# Patient Record
Sex: Female | Born: 1940 | Race: White | Hispanic: No | Marital: Married
Health system: Southern US, Community
[De-identification: ages and names within clinical notes are randomized; demographics above are authoritative.]

## PROBLEM LIST (undated history)

## (undated) ENCOUNTER — Emergency Department (HOSPITAL_COMMUNITY): Admission: EM | Payer: Medicare Other | Source: Home / Self Care

## (undated) DIAGNOSIS — N83209 Unspecified ovarian cyst, unspecified side: Secondary | ICD-10-CM

## (undated) DIAGNOSIS — I10 Essential (primary) hypertension: Secondary | ICD-10-CM

## (undated) DIAGNOSIS — L309 Dermatitis, unspecified: Secondary | ICD-10-CM

## (undated) DIAGNOSIS — E079 Disorder of thyroid, unspecified: Secondary | ICD-10-CM

## (undated) DIAGNOSIS — D649 Anemia, unspecified: Secondary | ICD-10-CM

## (undated) HISTORY — DX: Disorder of thyroid, unspecified: E07.9

## (undated) HISTORY — DX: Anemia, unspecified: D64.9

## (undated) HISTORY — DX: Essential (primary) hypertension: I10

## (undated) HISTORY — DX: Dermatitis, unspecified: L30.9

## (undated) HISTORY — DX: Unspecified ovarian cyst, unspecified side: N83.209

---

## 1998-10-23 ENCOUNTER — Other Ambulatory Visit: Admission: RE | Admit: 1998-10-23 | Discharge: 1998-10-23 | Payer: Self-pay | Admitting: *Deleted

## 1999-11-05 ENCOUNTER — Other Ambulatory Visit: Admission: RE | Admit: 1999-11-05 | Discharge: 1999-11-05 | Payer: Self-pay | Admitting: *Deleted

## 2000-11-08 ENCOUNTER — Other Ambulatory Visit: Admission: RE | Admit: 2000-11-08 | Discharge: 2000-11-08 | Payer: Self-pay | Admitting: *Deleted

## 2001-01-17 ENCOUNTER — Encounter: Admission: RE | Admit: 2001-01-17 | Discharge: 2001-01-17 | Payer: Self-pay | Admitting: Family Medicine

## 2001-01-17 ENCOUNTER — Encounter: Payer: Self-pay | Admitting: Family Medicine

## 2001-01-30 ENCOUNTER — Encounter (INDEPENDENT_AMBULATORY_CARE_PROVIDER_SITE_OTHER): Payer: Self-pay

## 2001-01-30 ENCOUNTER — Observation Stay (HOSPITAL_COMMUNITY): Admission: RE | Admit: 2001-01-30 | Discharge: 2001-01-30 | Payer: Self-pay | Admitting: Surgery

## 2001-11-13 ENCOUNTER — Other Ambulatory Visit: Admission: RE | Admit: 2001-11-13 | Discharge: 2001-11-13 | Payer: Self-pay | Admitting: *Deleted

## 2002-11-22 ENCOUNTER — Other Ambulatory Visit: Admission: RE | Admit: 2002-11-22 | Discharge: 2002-11-22 | Payer: Self-pay | Admitting: *Deleted

## 2003-11-25 ENCOUNTER — Other Ambulatory Visit: Admission: RE | Admit: 2003-11-25 | Discharge: 2003-11-25 | Payer: Self-pay | Admitting: *Deleted

## 2004-02-11 ENCOUNTER — Ambulatory Visit: Payer: Self-pay | Admitting: Family Medicine

## 2004-02-25 ENCOUNTER — Ambulatory Visit: Payer: Self-pay | Admitting: Family Medicine

## 2004-05-07 ENCOUNTER — Ambulatory Visit: Payer: Self-pay | Admitting: Family Medicine

## 2004-05-14 ENCOUNTER — Ambulatory Visit: Payer: Self-pay | Admitting: Family Medicine

## 2004-06-09 ENCOUNTER — Ambulatory Visit: Payer: Self-pay | Admitting: Family Medicine

## 2004-06-29 ENCOUNTER — Ambulatory Visit: Payer: Self-pay | Admitting: Family Medicine

## 2005-06-21 ENCOUNTER — Ambulatory Visit: Payer: Self-pay | Admitting: Family Medicine

## 2005-07-15 ENCOUNTER — Encounter: Payer: Self-pay | Admitting: Family Medicine

## 2005-07-15 ENCOUNTER — Other Ambulatory Visit: Admission: RE | Admit: 2005-07-15 | Discharge: 2005-07-15 | Payer: Self-pay | Admitting: Family Medicine

## 2005-07-15 ENCOUNTER — Ambulatory Visit: Payer: Self-pay | Admitting: Family Medicine

## 2005-08-18 ENCOUNTER — Ambulatory Visit: Payer: Self-pay | Admitting: Family Medicine

## 2006-02-17 ENCOUNTER — Ambulatory Visit: Payer: Self-pay | Admitting: Family Medicine

## 2006-06-20 ENCOUNTER — Encounter: Payer: Self-pay | Admitting: Family Medicine

## 2006-06-20 ENCOUNTER — Other Ambulatory Visit: Admission: RE | Admit: 2006-06-20 | Discharge: 2006-06-20 | Payer: Self-pay | Admitting: Family Medicine

## 2006-06-20 ENCOUNTER — Ambulatory Visit: Payer: Self-pay | Admitting: Family Medicine

## 2006-06-20 LAB — CONVERTED CEMR LAB
ALT: 17 units/L (ref 0–40)
AST: 21 units/L (ref 0–37)
Albumin: 3.9 g/dL (ref 3.5–5.2)
Alkaline Phosphatase: 46 units/L (ref 39–117)
BUN: 26 mg/dL — ABNORMAL HIGH (ref 6–23)
Basophils Absolute: 0.1 10*3/uL (ref 0.0–0.1)
Basophils Relative: 0.8 % (ref 0.0–1.0)
Bilirubin, Direct: 0.2 mg/dL (ref 0.0–0.3)
CO2: 30 meq/L (ref 19–32)
Calcium: 9.6 mg/dL (ref 8.4–10.5)
Chloride: 103 meq/L (ref 96–112)
Cholesterol: 166 mg/dL (ref 0–200)
Creatinine, Ser: 1.1 mg/dL (ref 0.4–1.2)
Eosinophils Absolute: 0.2 10*3/uL (ref 0.0–0.6)
Eosinophils Relative: 2.5 % (ref 0.0–5.0)
GFR calc Af Amer: 64 mL/min
GFR calc non Af Amer: 53 mL/min
Glucose, Bld: 97 mg/dL (ref 70–99)
HCT: 36.5 % (ref 36.0–46.0)
HDL: 45.4 mg/dL (ref >39.0)
Hemoglobin: 12.7 g/dL (ref 12.0–15.0)
LDL Cholesterol: 99 mg/dL (ref 0–99)
Lymphocytes Relative: 28.4 % (ref 12.0–46.0)
MCHC: 34.7 g/dL (ref 30.0–36.0)
MCV: 89 fL (ref 78.0–100.0)
Monocytes Absolute: 0.6 10*3/uL (ref 0.2–0.7)
Monocytes Relative: 8.6 % (ref 3.0–11.0)
Neutro Abs: 3.9 10*3/uL (ref 1.4–7.7)
Neutrophils Relative %: 59.7 % (ref 43.0–77.0)
Platelets: 302 10*3/uL (ref 150–400)
Potassium: 4.1 meq/L (ref 3.5–5.1)
RBC: 4.1 M/uL (ref 3.87–5.11)
RDW: 12 % (ref 11.5–14.6)
Sodium: 141 meq/L (ref 135–145)
TSH: 1.19 microintl units/mL (ref 0.35–5.50)
Total Bilirubin: 0.8 mg/dL (ref 0.3–1.2)
Total CHOL/HDL Ratio: 3.7
Total Protein: 6.3 g/dL (ref 6.0–8.3)
Triglycerides: 109 mg/dL (ref 0–149)
VLDL: 22 mg/dL (ref 0–40)
WBC: 6.7 10*3/uL (ref 4.5–10.5)

## 2006-10-03 ENCOUNTER — Ambulatory Visit: Payer: Self-pay | Admitting: Family Medicine

## 2006-10-10 ENCOUNTER — Ambulatory Visit: Payer: Self-pay | Admitting: Family Medicine

## 2006-11-18 DIAGNOSIS — Z8739 Personal history of other diseases of the musculoskeletal system and connective tissue: Secondary | ICD-10-CM

## 2006-12-02 ENCOUNTER — Ambulatory Visit: Payer: Self-pay | Admitting: Family Medicine

## 2006-12-26 ENCOUNTER — Telehealth: Payer: Self-pay | Admitting: Family Medicine

## 2007-01-20 ENCOUNTER — Telehealth: Payer: Self-pay | Admitting: Family Medicine

## 2007-02-16 ENCOUNTER — Ambulatory Visit: Payer: Self-pay | Admitting: Family Medicine

## 2007-06-12 ENCOUNTER — Ambulatory Visit: Payer: Self-pay | Admitting: Family Medicine

## 2007-06-12 DIAGNOSIS — T887XXA Unspecified adverse effect of drug or medicament, initial encounter: Secondary | ICD-10-CM | POA: Insufficient documentation

## 2007-06-12 DIAGNOSIS — N3000 Acute cystitis without hematuria: Secondary | ICD-10-CM

## 2007-06-12 DIAGNOSIS — D649 Anemia, unspecified: Secondary | ICD-10-CM | POA: Insufficient documentation

## 2007-06-12 DIAGNOSIS — E785 Hyperlipidemia, unspecified: Secondary | ICD-10-CM

## 2007-06-12 DIAGNOSIS — E039 Hypothyroidism, unspecified: Secondary | ICD-10-CM | POA: Insufficient documentation

## 2007-06-12 LAB — CONVERTED CEMR LAB
ALT: 15 units/L (ref 0–35)
AST: 19 units/L (ref 0–37)
Basophils Relative: 1.9 % — ABNORMAL HIGH (ref 0.0–1.0)
Bilirubin Urine: NEGATIVE
Bilirubin, Direct: 0.1 mg/dL (ref 0.0–0.3)
Blood in Urine, dipstick: NEGATIVE
Calcium: 9.2 mg/dL (ref 8.4–10.5)
Chloride: 106 meq/L (ref 96–112)
Eosinophils Absolute: 0.3 10*3/uL (ref 0.0–0.6)
Eosinophils Relative: 5.3 % — ABNORMAL HIGH (ref 0.0–5.0)
GFR calc non Af Amer: 48 mL/min
Glucose, Bld: 97 mg/dL (ref 70–99)
Glucose, Urine, Semiquant: NEGATIVE
Ketones, urine, test strip: NEGATIVE
LDL Cholesterol: 103 mg/dL — ABNORMAL HIGH (ref 0–99)
MCV: 90.8 fL (ref 78.0–100.0)
Nitrite: NEGATIVE
Platelets: 288 10*3/uL (ref 150–400)
RBC: 3.67 M/uL — ABNORMAL LOW (ref 3.87–5.11)
RDW: 11.8 % (ref 11.5–14.6)
Specific Gravity, Urine: 1.02
Total CHOL/HDL Ratio: 4.1
Triglycerides: 79 mg/dL (ref 0–149)
Urobilinogen, UA: 0.2
VLDL: 16 mg/dL (ref 0–40)
WBC: 6.3 10*3/uL (ref 4.5–10.5)
pH: 7

## 2007-06-26 ENCOUNTER — Ambulatory Visit: Payer: Self-pay | Admitting: Family Medicine

## 2007-06-26 ENCOUNTER — Encounter: Payer: Self-pay | Admitting: Family Medicine

## 2007-06-26 ENCOUNTER — Other Ambulatory Visit: Admission: RE | Admit: 2007-06-26 | Discharge: 2007-06-26 | Payer: Self-pay | Admitting: Family Medicine

## 2007-06-26 DIAGNOSIS — I10 Essential (primary) hypertension: Secondary | ICD-10-CM | POA: Insufficient documentation

## 2007-06-26 DIAGNOSIS — N952 Postmenopausal atrophic vaginitis: Secondary | ICD-10-CM

## 2007-06-26 DIAGNOSIS — L301 Dyshidrosis [pompholyx]: Secondary | ICD-10-CM

## 2007-07-27 ENCOUNTER — Ambulatory Visit: Payer: Self-pay | Admitting: Family Medicine

## 2007-08-01 ENCOUNTER — Telehealth: Payer: Self-pay | Admitting: Family Medicine

## 2007-08-01 DIAGNOSIS — L820 Inflamed seborrheic keratosis: Secondary | ICD-10-CM

## 2007-08-02 ENCOUNTER — Encounter: Payer: Self-pay | Admitting: Gastroenterology

## 2007-08-02 ENCOUNTER — Ambulatory Visit: Payer: Self-pay | Admitting: Gastroenterology

## 2007-08-16 ENCOUNTER — Telehealth: Payer: Self-pay | Admitting: Gastroenterology

## 2008-01-19 ENCOUNTER — Ambulatory Visit: Payer: Self-pay | Admitting: Family Medicine

## 2008-06-07 ENCOUNTER — Telehealth: Payer: Self-pay | Admitting: Internal Medicine

## 2008-06-12 ENCOUNTER — Ambulatory Visit: Payer: Self-pay | Admitting: Internal Medicine

## 2008-06-12 DIAGNOSIS — K5289 Other specified noninfective gastroenteritis and colitis: Secondary | ICD-10-CM | POA: Insufficient documentation

## 2008-06-12 LAB — CONVERTED CEMR LAB
AST: 27 units/L (ref 0–37)
Albumin: 3.7 g/dL (ref 3.5–5.2)
Alkaline Phosphatase: 50 units/L (ref 39–117)
BUN: 21 mg/dL (ref 6–23)
Bilirubin, Direct: 0.1 mg/dL (ref 0.0–0.3)
Chloride: 104 meq/L (ref 96–112)
Eosinophils Absolute: 0.2 10*3/uL (ref 0.0–0.7)
Eosinophils Relative: 2.9 % (ref 0.0–5.0)
GFR calc non Af Amer: 59 mL/min
Glucose, Bld: 82 mg/dL (ref 70–99)
HDL: 35.9 mg/dL — ABNORMAL LOW (ref >39.0)
Monocytes Relative: 8.6 % (ref 3.0–12.0)
Neutrophils Relative %: 55.1 % (ref 43.0–77.0)
Platelets: 327 10*3/uL (ref 150–400)
Potassium: 2.9 meq/L — ABNORMAL LOW (ref 3.5–5.1)
Sodium: 141 meq/L (ref 135–145)
Total CHOL/HDL Ratio: 3.7
VLDL: 17 mg/dL (ref 0–40)
WBC: 7.7 10*3/uL (ref 4.5–10.5)

## 2008-06-18 ENCOUNTER — Encounter: Payer: Self-pay | Admitting: Family Medicine

## 2008-06-18 ENCOUNTER — Ambulatory Visit: Payer: Self-pay | Admitting: Family Medicine

## 2008-06-18 ENCOUNTER — Other Ambulatory Visit: Admission: RE | Admit: 2008-06-18 | Discharge: 2008-06-18 | Payer: Self-pay | Admitting: Family Medicine

## 2008-06-18 DIAGNOSIS — N852 Hypertrophy of uterus: Secondary | ICD-10-CM | POA: Insufficient documentation

## 2008-06-19 ENCOUNTER — Encounter: Admission: RE | Admit: 2008-06-19 | Discharge: 2008-06-19 | Payer: Self-pay | Admitting: Family Medicine

## 2008-07-23 ENCOUNTER — Encounter: Admission: RE | Admit: 2008-07-23 | Discharge: 2008-07-23 | Payer: Self-pay | Admitting: Family Medicine

## 2008-08-06 ENCOUNTER — Ambulatory Visit: Payer: Self-pay | Admitting: Family Medicine

## 2008-08-06 DIAGNOSIS — L255 Unspecified contact dermatitis due to plants, except food: Secondary | ICD-10-CM

## 2008-08-06 DIAGNOSIS — R609 Edema, unspecified: Secondary | ICD-10-CM | POA: Insufficient documentation

## 2008-08-06 DIAGNOSIS — I872 Venous insufficiency (chronic) (peripheral): Secondary | ICD-10-CM | POA: Insufficient documentation

## 2008-09-11 ENCOUNTER — Ambulatory Visit: Payer: Self-pay | Admitting: Family Medicine

## 2008-09-11 DIAGNOSIS — H1030 Unspecified acute conjunctivitis, unspecified eye: Secondary | ICD-10-CM | POA: Insufficient documentation

## 2008-09-11 DIAGNOSIS — H1010 Acute atopic conjunctivitis, unspecified eye: Secondary | ICD-10-CM

## 2009-01-22 ENCOUNTER — Ambulatory Visit: Payer: Self-pay | Admitting: Family Medicine

## 2009-05-30 IMAGING — US US TRANSVAGINAL NON-OB
1 series · 14 of 25 positions shown · non-contrast
Comparison: None

CLINICAL DATA: Enlarged uterus on exam.

TRANSABDOMINAL AND TRANSVAGINAL ULTRASOUND OF PELVIS
TECHNIQUE: Both transabdominal and transvaginal ultrasound
examinations of the pelvis were performed including evaluation of
the uterus, ovaries, adnexal regions, and pelvic cul-de-sac.

[Series 1: us transvaginal non-ob · 0.29mm/px · 14 of 60 slices shown]
[im 1/60]
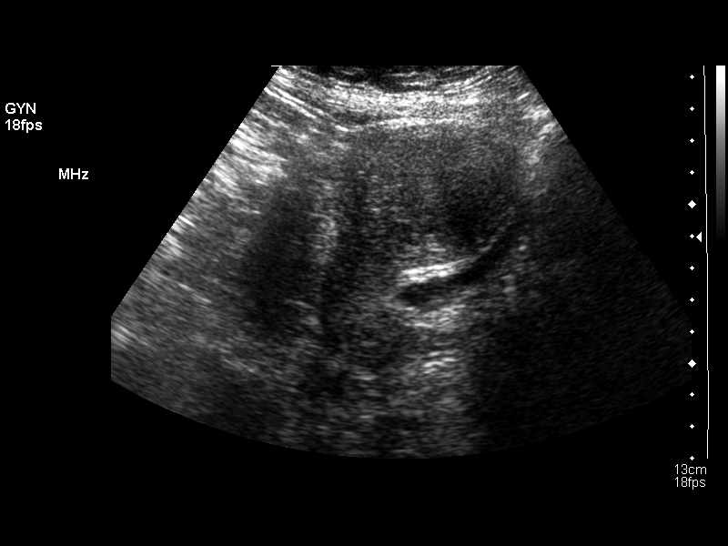
[im 5/60]
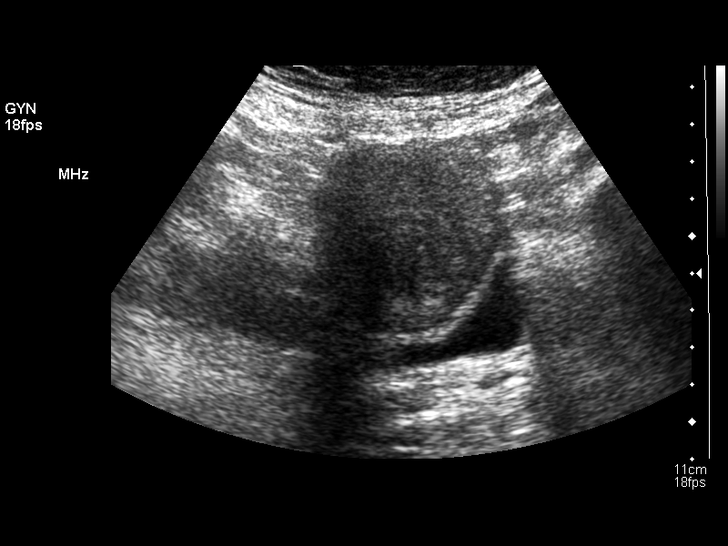
[im 10/60]
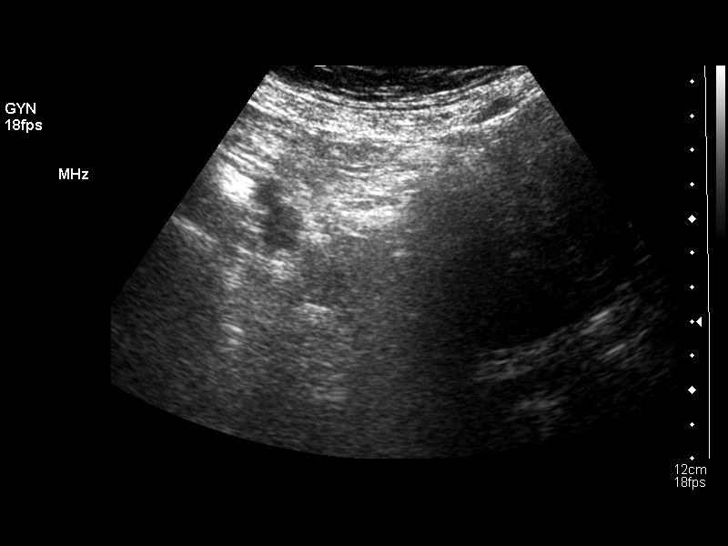
[im 15/60]
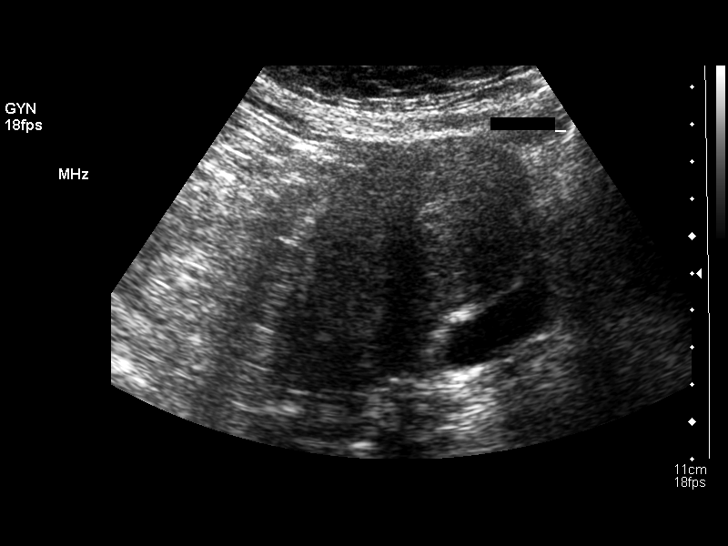
[im 20/60]
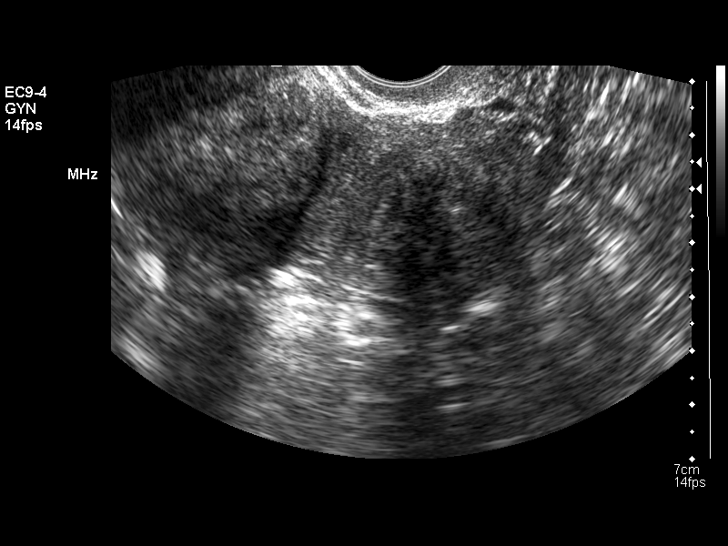
[im 23/60]
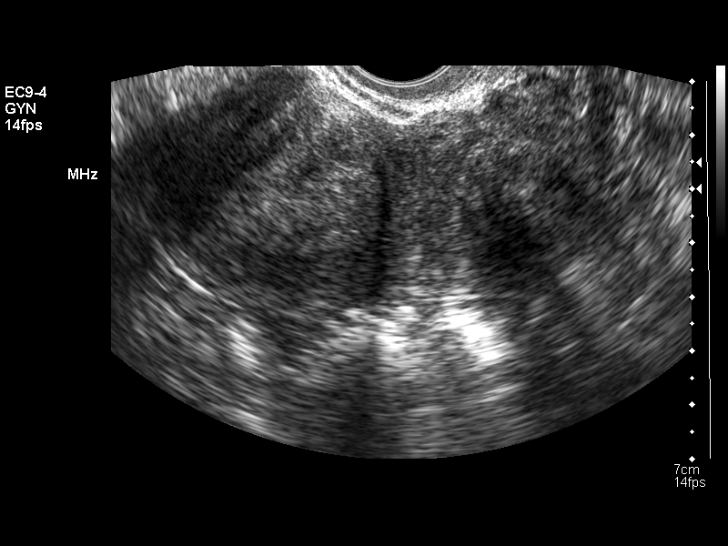
[im 28/60]
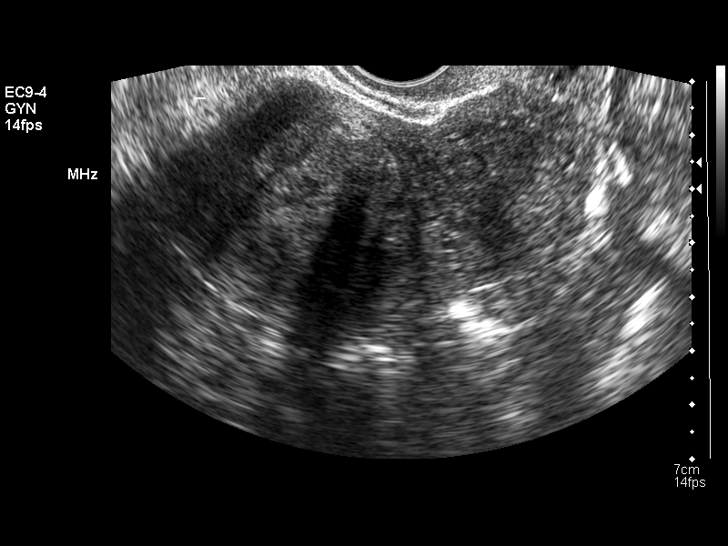
[im 32/60]
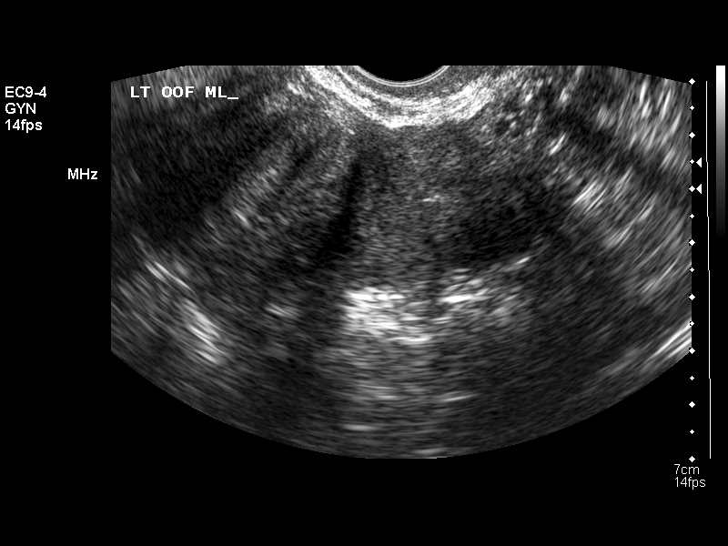
[im 37/60]
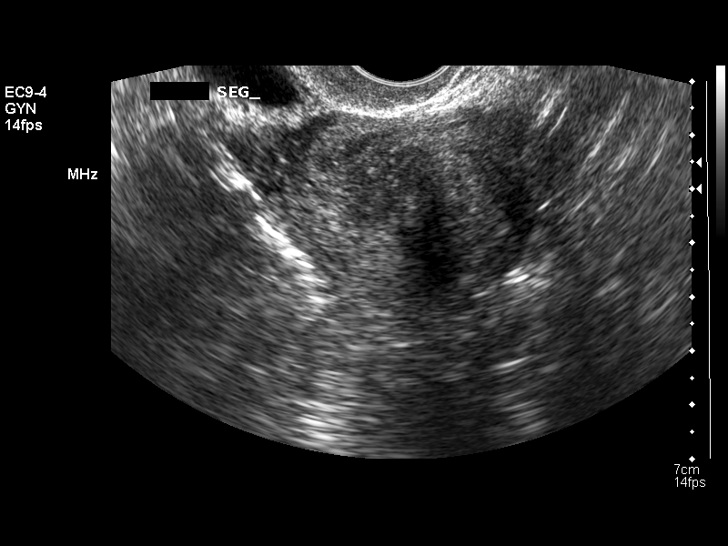
[im 40/60]
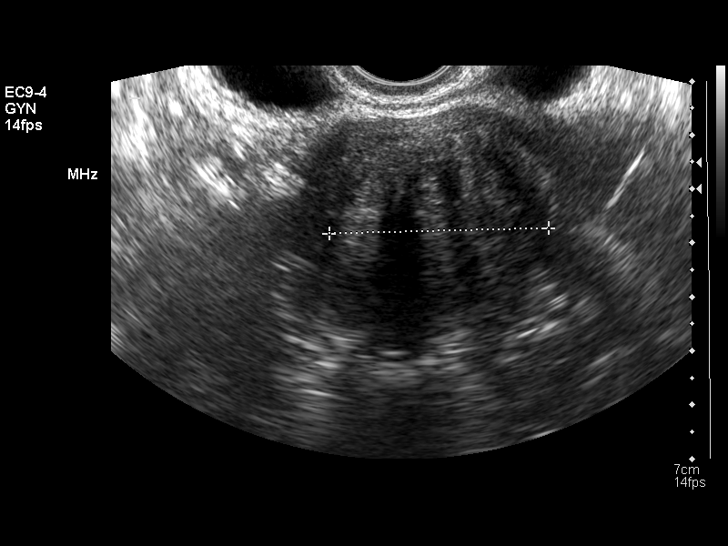
[im 45/60]
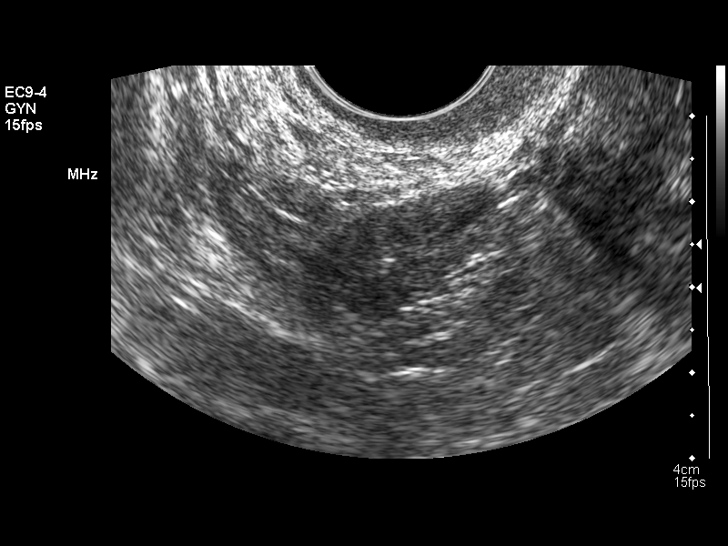
[im 50/60]
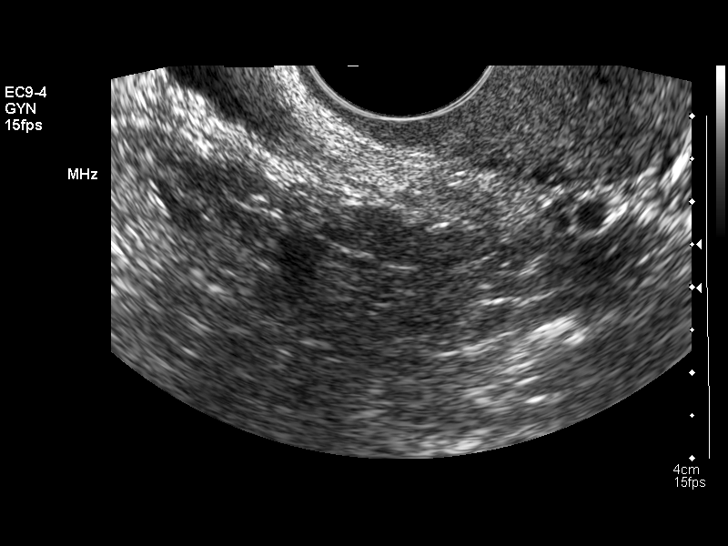
[im 55/60]
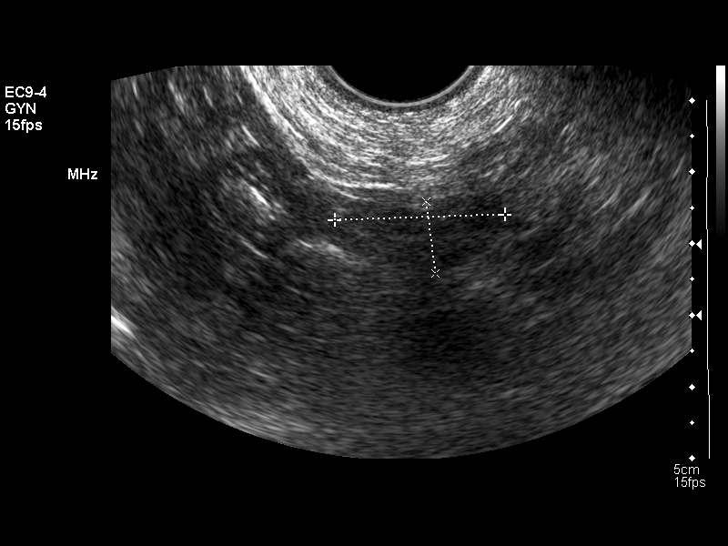
[im 60/60]
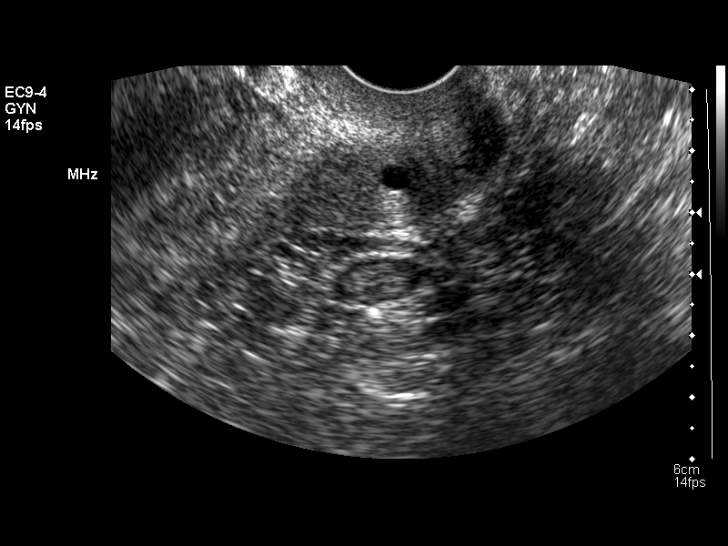

[14 of 25 positions shown; findings below may reference images not displayed]

FINDINGS: Uterus:  Uterus measures 8.5 x 5.2 x 5.1 cm.  Two fibroids noted,
the first posteriorly in the uterine body measuring up to 3 cm.
There is a second fibroid in the fundus centrally measuring up to
4.1 cm.

Endometrium:  Not visualized.  Obscured by the fibroids.

Right Ovary:  2.0 x 0.9 x 2.1 cm.  No abnormality.

Left Ovary:  2.4 x 1.0 x 1.5 cm.  No abnormality.

Other Findings:  No free fluid.
IMPRESSION: Uterine fibroids.  Otherwise unremarkable exam.

## 2009-06-02 ENCOUNTER — Telehealth: Payer: Self-pay | Admitting: Family Medicine

## 2009-06-19 ENCOUNTER — Ambulatory Visit: Payer: Self-pay | Admitting: Family Medicine

## 2009-06-19 ENCOUNTER — Other Ambulatory Visit: Admission: RE | Admit: 2009-06-19 | Discharge: 2009-06-19 | Payer: Self-pay | Admitting: Family Medicine

## 2009-07-10 ENCOUNTER — Encounter: Admission: RE | Admit: 2009-07-10 | Discharge: 2009-07-10 | Payer: Self-pay | Admitting: Family Medicine

## 2010-01-29 ENCOUNTER — Ambulatory Visit: Payer: Self-pay | Admitting: Family Medicine

## 2010-05-03 LAB — CONVERTED CEMR LAB
AST: 20 units/L (ref 0–37)
Albumin: 3.8 g/dL (ref 3.5–5.2)
Basophils Absolute: 0 10*3/uL (ref 0.0–0.1)
Basophils Absolute: 0 10*3/uL (ref 0.0–0.1)
CO2: 30 meq/L (ref 19–32)
Chloride: 104 meq/L (ref 96–112)
Eosinophils Absolute: 0.2 10*3/uL (ref 0.0–0.7)
Eosinophils Absolute: 0.3 10*3/uL (ref 0.0–0.7)
Glucose, Bld: 95 mg/dL (ref 70–99)
HCT: 35.8 % — ABNORMAL LOW (ref 36.0–46.0)
HCT: 37.3 % (ref 36.0–46.0)
Hemoglobin: 12.2 g/dL (ref 12.0–15.0)
Hemoglobin: 12.6 g/dL (ref 12.0–15.0)
Ketones, urine, test strip: NEGATIVE
Lymphs Abs: 1.7 10*3/uL (ref 0.7–4.0)
MCHC: 33.6 g/dL (ref 30.0–36.0)
MCHC: 34 g/dL (ref 30.0–36.0)
MCV: 92.9 fL (ref 78.0–100.0)
Monocytes Absolute: 0.5 10*3/uL (ref 0.1–1.0)
Monocytes Absolute: 0.7 10*3/uL (ref 0.1–1.0)
Monocytes Relative: 7.2 % (ref 3.0–12.0)
Neutro Abs: 4.2 10*3/uL (ref 1.4–7.7)
Neutro Abs: 4.9 10*3/uL (ref 1.4–7.7)
Potassium: 3.7 meq/L (ref 3.5–5.1)
Protein, U semiquant: NEGATIVE
RDW: 12 % (ref 11.5–14.6)
RDW: 12.1 % (ref 11.5–14.6)
Sodium: 140 meq/L (ref 135–145)
Specific Gravity, Urine: 1.015
TSH: 1.44 microintl units/mL (ref 0.35–5.50)
Urobilinogen, UA: 0.2
Vitamin B-12: 316 pg/mL (ref 211–911)
WBC Urine, dipstick: NEGATIVE
pH: 6.5

## 2010-05-07 NOTE — Progress Notes (Signed)
Summary: REFILL  Phone Note Refill Request Message from:  Fax from Pharmacy  Refills Requested: Medication #1:  TENORMIN 25 MG TABS Take 1 tablet by mouth every morning [facility] [facility]---[phone]     FAX---[phone]  Initial call taken by: Warnell Forester,  June 02, 2009 9:02 AM    Prescriptions: TENORMIN 25 MG TABS (ATENOLOL) Take 1 tablet by mouth every morning  #100 x 3   Entered by:   Kern Reap CMA (AAMA)   Authorized by:   Roderick Pee MD   Signed by:   Kern Reap CMA (AAMA) on 06/02/2009   Method used:   Electronically to        [facility] (retail)       [address]       [place], [place]  [address]       Ph: [phone]       Fax: [phone]   RxID:   [id number]

## 2010-05-07 NOTE — Assessment & Plan Note (Signed)
Summary: EMP//WILL FAST//CM/PT [facility] FROM [facility]   Vital Signs:  Patient profile:   70 year old female Menstrual status:  postmenopausal Height:      69 inches Weight:      170 pounds BMI:     25.20 Temp:     98.5 degrees F oral BP sitting:   130 / 88  (left arm) Cuff size:   regular  Vitals Entered By: Kern Reap CMA Duncan Dull) (June 19, 2009 10:31 AM)  Reason for Visit cpx  Primary Care Provider:  Kelle Darting, MD   History of Present Illness: haillie is a 70 year old, married female, nonsmoker, who comes in today for evaluation of hypothyroidism.  Hyper-tension.  She takes thyroid 100 micrograms daily.  Will check thyroid level today.  She takes Tenormin 25 mg daily and HCTZ 12.5 mg daily.  BP 130/88.  She also takes calcium, vitamin D, aspirin, and walks daily.  Her past medical history, social history, family history reviewed in detail.  No significant changes.  She walks daily.  Mood is normal.  No depression.  Hearing and vision.  Normal.  ADLs.  Normal.  Risks for falls at home and home safety reviewed.  Height and weight normal annual eye exam by ophthalmologist.  We will check lab work to monitor blood sugar et Karie Soda.  She also gets routine dental work and declines a colonoscopy.  Tetanus 2007 seasonal flu 2010 Pneumovax 2007  Allergies: 1)  Sulfamethoxazole (Sulfamethoxazole)  Past History:  Past medical, surgical, family and social histories (including risk factors) reviewed, and no changes noted (except as noted below).  Past Medical History: Reviewed history from 06/26/2007 and no changes required. Anemia-NOS Hypertension Hypothyroidism childbirth x 1 TMA ovarian cyst eczema  Family History: Reviewed history from 08/02/2007 and no changes required. father died at 46 he had coronary disease, and gallbladder disease mother died 45, diabetes, hypertension, gallbladder disease, underlying glaucoma. No brothers one sister has gallbladder disease.   Uterus out and hypertension. No FH of Colon Cancer:  Social History: Reviewed history from 06/26/2007 and no changes required. Retired Married Never Smoked Alcohol use-no Drug use-no Regular exercise-yes  Review of Systems      See HPI  Physical Exam  General:  Well-developed,well-nourished,in no acute distress; alert,appropriate and cooperative throughout examination Head:  Normocephalic and atraumatic without obvious abnormalities. No apparent alopecia or balding. Eyes:  No corneal or conjunctival inflammation noted. EOMI. Perrla. Funduscopic exam benign, without hemorrhages, exudates or papilledema. Vision grossly normal. Ears:  External ear exam shows no significant lesions or deformities.  Otoscopic examination reveals clear canals, tympanic membranes are intact bilaterally without bulging, retraction, inflammation or discharge. Hearing is grossly normal bilaterally. Nose:  External nasal examination shows no deformity or inflammation. Nasal mucosa are pink and moist without lesions or exudates. Mouth:  Oral mucosa and oropharynx without lesions or exudates.  Teeth in good repair. Neck:  No deformities, masses, or tenderness noted. Chest Wall:  No deformities, masses, or tenderness noted. Breasts:  No mass, nodules, thickening, tenderness, bulging, retraction, inflamation, nipple discharge or skin changes noted.   Lungs:  Normal respiratory effort, chest expands symmetrically. Lungs are clear to auscultation, no crackles or wheezes. Heart:  Normal rate and regular rhythm. S1 and S2 normal without gallop, murmur, click, rub or other extra sounds. Abdomen:  Bowel sounds positive,abdomen soft and non-tender without masses, organomegaly or hernias noted. Rectal:  No external abnormalities noted. Normal sphincter tone. No rectal masses or tenderness. Genitalia:  Pelvic Exam:  External: normal female genitalia without lesions or masses        Vagina: normal without lesions or  masses        Cervix: normal without lesions or masses        Adnexa: normal bimanual exam without masses or fullness        Uterus: normal by palpation        Pap smear: performed Msk:  No deformity or scoliosis noted of thoracic or lumbar spine.   Pulses:  R and L carotid,radial,femoral,dorsalis pedis and posterior tibial pulses are full and equal bilaterally Extremities:  No clubbing, cyanosis, edema, or deformity noted with normal full range of motion of all joints.   Neurologic:  No cranial nerve deficits noted. Station and gait are normal. Plantar reflexes are down-going bilaterally. DTRs are symmetrical throughout. Sensory, motor and coordinative functions appear intact. Skin:  total body skin exam normal except for hundreds  of seborrheic keratoses Cervical Nodes:  No lymphadenopathy noted Axillary Nodes:  No palpable lymphadenopathy Inguinal Nodes:  No significant adenopathy Psych:  Cognition and judgment appear intact. Alert and cooperative with normal attention span and concentration. No apparent delusions, illusions, hallucinations   Impression & Recommendations:  Problem # 1:  PERIPHERAL EDEMA (ICD-782.3) Assessment Improved  Her updated medication list for this problem includes:    Hydrochlorothiazide 12.5 Mg Caps (Hydrochlorothiazide) .Marland Kitchen... Take 1 tablet by mouth every morning  Problem # 2:  HYPERTENSION (ICD-401.9) Assessment: Improved  Her updated medication list for this problem includes:    Tenormin 25 Mg Tabs (Atenolol) .Marland Kitchen... Take 1 tablet by mouth every morning    Hydrochlorothiazide 12.5 Mg Caps (Hydrochlorothiazide) .Marland Kitchen... Take 1 tablet by mouth every morning  Orders: Venipuncture (56213) TLB-Lipid Panel (80061-LIPID) TLB-BMP (Basic Metabolic Panel-BMET) (80048-METABOL) TLB-CBC Platelet - w/Differential (85025-CBCD) TLB-Hepatic/Liver Function Pnl (80076-HEPATIC) TLB-TSH (Thyroid Stimulating Hormone) (08657-QIO) Prescription Created Electronically  [phone]) Subsequent annual wellness visit with prevention plan (M8413) UA Dipstick w/o Micro (automated)  (81003)  Problem # 3:  HYPOTHYROIDISM (ICD-244.9) Assessment: Improved  Her updated medication list for this problem includes:    Levoxyl 100 Mcg Tabs (Levothyroxine sodium) .Marland Kitchen... 1 once daily  Orders: Venipuncture (24401) TLB-Lipid Panel (80061-LIPID) TLB-BMP (Basic Metabolic Panel-BMET) (80048-METABOL) TLB-CBC Platelet - w/Differential (85025-CBCD) TLB-Hepatic/Liver Function Pnl (80076-HEPATIC) TLB-TSH (Thyroid Stimulating Hormone) (02725-DGU) Prescription Created Electronically [phone]) Subsequent annual wellness visit with prevention plan (V4259) UA Dipstick w/o Micro (automated)  (81003)  Complete Medication List: 1)  Calcitrate/vitamin D 315-200 Mg-unit Tabs (Calcium citrate-vitamin d) .... Take 1 tablet by mouth once a day 2)  Levoxyl 100 Mcg Tabs (Levothyroxine sodium) .Marland Kitchen.. 1 once daily 3)  Tenormin 25 Mg Tabs (Atenolol) .... Take 1 tablet by mouth every morning 4)  Hydrochlorothiazide 12.5 Mg Caps (Hydrochlorothiazide) .... Take 1 tablet by mouth every morning  Other Orders: EKG w/ Interpretation (93000)  Patient Instructions: 1)  Please schedule a follow-up appointment in 1 year. 2)  It is important that you exercise regularly at least 20 minutes 5 times a week. If you develop chest pain, have severe difficulty breathing, or feel very tired , stop exercising immediately and seek medical attention. 3)  Schedule a colonoscopy/sigmoidoscopy to help detect colon cancer. 4)  Take calcium +Vitamin D daily. 5)  Take an Aspirin every day. 6)  Schedule your mammogram. Prescriptions: HYDROCHLOROTHIAZIDE 12.5 MG CAPS (HYDROCHLOROTHIAZIDE) Take 1 tablet by mouth every morning  #100 x 3   Entered and Authorized by:   Roderick Pee MD   Signed by:  Roderick Pee MD on 06/19/2009   Method used:   Electronically to        [facility] (retail)       [address]       [place], [place]  [address]       Ph: [phone]       Fax: [phone]   RxID:   [id number] TENORMIN 25 MG TABS (ATENOLOL) Take 1 tablet by mouth every morning  #100 x 3   Entered and Authorized by:   Roderick Pee MD   Signed by:   Roderick Pee MD on 06/19/2009   Method used:   Electronically to        [facility] (retail)       [address]       [place], [place]  [address]       Ph: [phone]       Fax: [phone]   RxID:   [id number] LEVOXYL 100 MCG TABS (LEVOTHYROXINE SODIUM) 1 once daily  #100 x 4   Entered and Authorized by:   Roderick Pee MD   Signed by:   Roderick Pee MD on 06/19/2009   Method used:   Electronically to        [facility] (retail)       [address]       [place], [place]  [address]       Ph: [phone]       Fax: [phone]   RxID:   [id number]     Laboratory Results   Urine Tests    Routine Urinalysis   Color: yellow Appearance: Clear Glucose: negative   (Normal Range: Negative) Bilirubin: negative   (Normal Range: Negative) Ketone: negative   (Normal Range: Negative) Spec. Gravity: 1.015   (Normal Range: 1.003-1.035) Blood: 1+   (Normal Range: Negative) pH: 6.5   (Normal Range: 5.0-8.0) Protein: negative   (Normal Range: Negative) Urobilinogen: 0.2   (Normal Range: 0-1) Nitrite: negative   (Normal Range: Negative) Leukocyte Esterace: negative   (Normal Range: Negative)    Comments: Rita Ohara  June 19, 2009 12:55 PM

## 2010-05-07 NOTE — Assessment & Plan Note (Signed)
Summary: FLU SHOT // RS----PT [facility] FOR EARLIER TIME // RS  Nurse Visit   Allergies: 1)  Sulfamethoxazole (Sulfamethoxazole)  Review of Systems       Flu Vaccine Consent Questions     Do you have a history of severe allergic reactions to this vaccine? no    Any prior history of allergic reactions to egg and/or gelatin? no    Do you have a sensitivity to the preservative Thimersol? no    Do you have a past history of Guillan-Barre Syndrome? no    Do you currently have an acute febrile illness? no    Have you ever had a severe reaction to latex? no    Vaccine information given and explained to patient? yes    Are you currently pregnant? no    Lot Number:[id number]   Exp Date:10/03/2010   Site Given  Left Deltoid IM    Orders Added: 1)  Flu Vaccine 6yrs + MEDICARE PATIENTS [Q2039] 2)  Administration Flu vaccine - MCR [G0008]

## 2010-05-22 ENCOUNTER — Other Ambulatory Visit: Payer: Self-pay | Admitting: *Deleted

## 2010-05-22 DIAGNOSIS — I10 Essential (primary) hypertension: Secondary | ICD-10-CM

## 2010-05-22 MED ORDER — HYDROCHLOROTHIAZIDE 12.5 MG PO TABS: 12.5000 mg | ORAL_TABLET | Freq: Every day | ORAL | Status: DC

## 2010-07-01 ENCOUNTER — Other Ambulatory Visit (HOSPITAL_COMMUNITY)
Admission: RE | Admit: 2010-07-01 | Discharge: 2010-07-01 | Disposition: A | Discharge status: Home / Self Care | Payer: Medicare Other | Source: Ambulatory Visit | Attending: Family Medicine | Admitting: Family Medicine

## 2010-07-01 ENCOUNTER — Ambulatory Visit (INDEPENDENT_AMBULATORY_CARE_PROVIDER_SITE_OTHER): Payer: Medicare Other | Admitting: Family Medicine

## 2010-07-01 ENCOUNTER — Encounter: Payer: Self-pay | Admitting: Family Medicine

## 2010-07-01 VITALS — BP 120/80 | Temp 98.7°F | Ht 68.0 in | Wt 178.0 lb

## 2010-07-01 DIAGNOSIS — E039 Hypothyroidism, unspecified: Secondary | ICD-10-CM

## 2010-07-01 DIAGNOSIS — I10 Essential (primary) hypertension: Secondary | ICD-10-CM

## 2010-07-01 DIAGNOSIS — Z01419 Encounter for gynecological examination (general) (routine) without abnormal findings: Secondary | ICD-10-CM | POA: Insufficient documentation

## 2010-07-01 DIAGNOSIS — E785 Hyperlipidemia, unspecified: Secondary | ICD-10-CM

## 2010-07-01 LAB — CBC WITH DIFFERENTIAL/PLATELET
Eosinophils Relative: 2.1 % (ref 0.0–5.0)
Monocytes Relative: 8.2 % (ref 3.0–12.0)
Neutrophils Relative %: 64.8 % (ref 43.0–77.0)
Platelets: 298 10*3/uL (ref 150.0–400.0)
WBC: 5.5 10*3/uL (ref 4.5–10.5)

## 2010-07-01 LAB — HEPATIC FUNCTION PANEL
ALT: 20 U/L (ref 0–35)
AST: 20 U/L (ref 0–37)
Bilirubin, Direct: 0.1 mg/dL (ref 0.0–0.3)
Total Bilirubin: 0.5 mg/dL (ref 0.3–1.2)

## 2010-07-01 LAB — LIPID PANEL
HDL: 42.8 mg/dL (ref >39.00)
VLDL: 18.6 mg/dL (ref 0.0–40.0)

## 2010-07-01 LAB — BASIC METABOLIC PANEL
BUN: 21 mg/dL (ref 6–23)
Calcium: 9.3 mg/dL (ref 8.4–10.5)
Chloride: 103 mEq/L (ref 96–112)
Creatinine, Ser: 1.1 mg/dL (ref 0.4–1.2)
GFR: 53.17 mL/min — ABNORMAL LOW (ref >60.00)

## 2010-07-01 LAB — TSH: TSH: 1.42 u[IU]/mL (ref 0.35–5.50)

## 2010-07-01 MED ORDER — ATENOLOL 25 MG PO TABS: 25.0000 mg | ORAL_TABLET | Freq: Every day | ORAL | Status: DC

## 2010-07-01 MED ORDER — LEVOTHYROXINE SODIUM 100 MCG PO TABS: 100.0000 ug | ORAL_TABLET | Freq: Every day | ORAL | Status: DC

## 2010-07-01 NOTE — Patient Instructions (Signed)
Continue your current medications.  Motrin 400 mg twice daily with food to help with your joint pain.  Be sure to get your mammogram.  You should hear from GI in the next week to get set up for a colonoscopy.  If nobody called to call [phone] and asked for GI.  Return in one year, sooner if any problems.  Set up a 30 minute appointment sometime in the next couple weeks to remove the 5 lesions.  We discussed

## 2010-07-01 NOTE — Progress Notes (Signed)
  Subjective:    Patient ID: Ariana Henderson, female    DOB: 08/01/1940, 70 y.o.   MRN: [id number]  Ariana Henderson Is a 70 year old, married female, nonsmoker, who comes in today for general physical examination because of a history of hypothyroidism and hypertension.  Her hypothyroidism is Treated  with Synthroid 100 mcg daily.  Will check levels today.  Her hypertension is treated with atenolol 25 mg daily.  BP 120/80.  She gets routine eye care, dental care, BSE monthly, and you mammography, never had a colonoscopy as always declined.  However, this, year.  She's agreed to have a colonoscopy.  She has some general aches and pains, consistent with some mild osteoarthritis.  She also takes calcium and iron daily.  She retired and now is back to work full time.  Hobbies playing golf      Review of Systems  Constitutional: Negative.   HENT: Negative.   Eyes: Negative.   Respiratory: Negative.   Cardiovascular: Negative.   Gastrointestinal: Negative.   Genitourinary: Negative.   Musculoskeletal: Negative.   Neurological: Negative.   Hematological: Negative.   Psychiatric/Behavioral: Negative.        Objective:   Physical Exam  Constitutional: She appears well-developed and well-nourished.  HENT:  Head: Normocephalic and atraumatic.  Right Ear: External ear normal.  Left Ear: External ear normal.  Nose: Nose normal.  Mouth/Throat: Oropharynx is clear and moist.  Eyes: EOM are normal. Pupils are equal, round, and reactive to light.  Neck: Normal range of motion. Neck supple. No thyromegaly present.  Cardiovascular: Normal rate, regular rhythm, normal heart sounds and intact distal pulses.  Exam reveals no gallop and no friction rub.   No murmur heard. Pulmonary/Chest: Effort normal and breath sounds normal.  Abdominal: Soft. Bowel sounds are normal. She exhibits no distension and no mass. There is no tenderness. There is no rebound.  Genitourinary: Vagina normal and uterus  normal. Guaiac negative stool. No vaginal discharge found.       The uterus is normal size.  Breasts exam normal.  On the right left thickening.  The from the 12 to 6 o'clock position in the periphery.  This is consistent with previous exams.  No change.  Patient aware.  Musculoskeletal: Normal range of motion.  Lymphadenopathy:    She has no cervical adenopathy.  Neurological: She is alert. She has normal reflexes. No cranial nerve deficit. She exhibits normal muscle tone. Coordination normal.  Skin: Skin is warm and dry.       Numerous seborrheic keratosis reenter back into on her face did show dark pigment.  Recommend we removed those  Psychiatric: She has a normal mood and affect. Her behavior is normal. Judgment and thought content normal.          Assessment & Plan:  Hypertension,,,,,,,,, continue medication.  Hypothyroidism,,,,,,,,,,,, continue Synthroid.  Five abnormal appearing seborrheic keratosis,,,,,,,,,,, advised to return for removal.  Patient agrees after all these years to have a screening colonoscopy!!!!!!!!!!

## 2010-07-06 NOTE — Progress Notes (Signed)
Spoke with patient's husband.  Labs are normal

## 2010-07-15 ENCOUNTER — Encounter: Payer: Self-pay | Admitting: Family Medicine

## 2010-07-16 ENCOUNTER — Ambulatory Visit (INDEPENDENT_AMBULATORY_CARE_PROVIDER_SITE_OTHER): Payer: Medicare Other | Admitting: Family Medicine

## 2010-07-16 ENCOUNTER — Other Ambulatory Visit: Payer: Self-pay | Admitting: Family Medicine

## 2010-07-16 ENCOUNTER — Encounter: Payer: Self-pay | Admitting: Family Medicine

## 2010-07-16 DIAGNOSIS — L820 Inflamed seborrheic keratosis: Secondary | ICD-10-CM

## 2010-07-16 DIAGNOSIS — L989 Disorder of the skin and subcutaneous tissue, unspecified: Secondary | ICD-10-CM

## 2010-07-16 DIAGNOSIS — D485 Neoplasm of uncertain behavior of skin: Secondary | ICD-10-CM

## 2010-07-16 NOTE — Patient Instructions (Signed)
Remove the Band-Aid tomorrow.  If in two weeks.  We do not call you call us with the path report

## 2010-07-16 NOTE — Progress Notes (Signed)
  Subjective:    Patient ID: Ariana Henderson, female    DOB: 1941-02-05, 70 y.o.   MRN: [id number]  HPI Ariana Henderson is a 70 year old female, who comes in today for removal of 4 lesions.  Number one is on her right face below her right ear,,,,,,,,,,,, 6 mm times 6 mm.  Number two right jaw,,,,,,,,,,, 6 mm times 6 mm.  Number 3,,,,,,,,,,,,,,10 mm times 10 mm below right scapula.  Number 4,,,,,,,,,,,10 mm times 10 mm left hip  After informed consent, the lesions were anesthetized with Xylocaine with epinephrine.  All 4 lesions were excised in toto without complications.  The base was cauterized Band-Aids were applied.  The lesions were sent for pathologic analysis and the patient left the office in good condition.  It she was advised to call us in two weeks.  If we did not call her with the pathology report   Review of Systems     Objective:   Physical Exam        Assessment & Plan:

## 2010-07-21 ENCOUNTER — Other Ambulatory Visit: Payer: Self-pay | Admitting: Family Medicine

## 2010-07-21 DIAGNOSIS — Z1231 Encounter for screening mammogram for malignant neoplasm of breast: Secondary | ICD-10-CM

## 2010-07-22 NOTE — Progress Notes (Signed)
Spoke with patient's husband

## 2010-07-24 ENCOUNTER — Ambulatory Visit
Admission: RE | Admit: 2010-07-24 | Discharge: 2010-07-24 | Disposition: A | Discharge status: Home / Self Care | Payer: Medicare Other | Source: Ambulatory Visit | Attending: Family Medicine | Admitting: Family Medicine

## 2010-07-24 DIAGNOSIS — Z1231 Encounter for screening mammogram for malignant neoplasm of breast: Secondary | ICD-10-CM

## 2010-08-21 NOTE — Op Note (Signed)
[facility]  Patient:    Ariana Henderson, Ariana Henderson Visit Number: [id number] MRN: [id number]          Service Type: SUR Location: 4W [id number] [id number] Attending Physician:  Shelly Rubenstein Proc. Date: 01/30/01 Admit Date:  01/30/2001 Discharge Date: 01/30/2001                             Operative Report  PREOPERATIVE DIAGNOSIS:  Symptomatic cholelithiasis.  POSTOPERATIVE DIAGNOSIS:  Symptomatic cholelithiasis.  PROCEDURE:  Laparoscopic cholecystectomy.  SURGEON:  Douglas A. Magnus Ivan, M.D.  ASSISTANT:  Donnie Coffin. Samuella Cota, M.D.  ANESTHESIA:  General endotracheal and 0.25% Marcaine.  ESTIMATED BLOOD LOSS:  Minimal.  DESCRIPTION OF PROCEDURE:  The patient was brought to the operating room and identified as [name].  She was placed supine on operating room table, and general anesthesia was induced.  Her abdomen was then prepped and draped in the usual sterile fashion.  Using a #15 blade, a small vertical incision was made below the umbilicus.  The incision was carried down to the fascia which was then opened with a scalpel.  A hemostat was then used to pass into the peritoneal cavity.  A 0 Vicryl pursestring suture was then placed around the fascial opening.  The Hasson port was then placed into the opening, and insufflation of the abdomen was begun.  An 11 mm port was placed in the patients epigastrium, and two 5 mm ports were placed in the patients right flank under direct vision.  The gallbladder was then grasped and retracted above the liver bed.  Several adhesions to the gallbladder and liver were taken down with the electrocautery.  The cystic duct was then dissected out and clipped three times proximally and once distally and transected with the scissors.  The cystic artery was then identified and clipped twice proximally and once distally and transected as well.  The gallbladder was then slowly dissected free from the liver bed with the  electrocautery.  During the dissection, the gallbladder was entered.  Once the gallbladder was completely removed, hemostasis appeared to be achieved in the liver bed.  The gallbladder was then placed in an Endosac and removed through the umbilical incision.  The 0 Vicryl at the umbilical incision was then tied in place, closing the fascial defect.  The abdomen was again irrigated with normal saline.  Hemostasis did appear to be achieved.  All ports were then removed under direct vision, and the abdomen was deflated.  All incisions were then anesthetized with 0.25% Marcaine and then closed with 4-0 Monocryl subcuticular sutures. Steri-Strips, gauze, and tape were then applied.  The patient tolerated the procedure well.  All sponge, needle, and instrument counts were correct at the end of the procedure.  The patient was then extubated in the operating room and taken in stable condition to the recovery room. Attending Physician:  Shelly Rubenstein DD:  01/30/01 TD:  01/30/01 Job: [id number]

## 2010-09-01 ENCOUNTER — Encounter: Payer: Self-pay | Admitting: Gastroenterology

## 2010-11-11 ENCOUNTER — Other Ambulatory Visit: Payer: Self-pay | Admitting: *Deleted

## 2010-11-11 DIAGNOSIS — I10 Essential (primary) hypertension: Secondary | ICD-10-CM

## 2010-11-11 MED ORDER — HYDROCHLOROTHIAZIDE 12.5 MG PO TABS: 12.5000 mg | ORAL_TABLET | Freq: Every day | ORAL | Status: DC

## 2011-01-08 ENCOUNTER — Ambulatory Visit (INDEPENDENT_AMBULATORY_CARE_PROVIDER_SITE_OTHER): Payer: Medicare Other

## 2011-01-08 DIAGNOSIS — Z23 Encounter for immunization: Secondary | ICD-10-CM

## 2011-06-18 ENCOUNTER — Other Ambulatory Visit: Payer: Self-pay | Admitting: *Deleted

## 2011-06-18 DIAGNOSIS — I10 Essential (primary) hypertension: Secondary | ICD-10-CM

## 2011-06-18 MED ORDER — ATENOLOL 25 MG PO TABS: 25.0000 mg | ORAL_TABLET | Freq: Every day | ORAL | Status: DC

## 2011-07-05 ENCOUNTER — Encounter: Payer: Self-pay | Admitting: Family Medicine

## 2011-07-05 ENCOUNTER — Ambulatory Visit (INDEPENDENT_AMBULATORY_CARE_PROVIDER_SITE_OTHER): Payer: Medicare Other | Admitting: Family Medicine

## 2011-07-05 VITALS — BP 148/80 | Temp 97.8°F | Ht 69.0 in | Wt 184.0 lb

## 2011-07-05 DIAGNOSIS — I872 Venous insufficiency (chronic) (peripheral): Secondary | ICD-10-CM

## 2011-07-05 DIAGNOSIS — E039 Hypothyroidism, unspecified: Secondary | ICD-10-CM

## 2011-07-05 DIAGNOSIS — Z0000 Encounter for general adult medical examination without abnormal findings: Secondary | ICD-10-CM

## 2011-07-05 DIAGNOSIS — E785 Hyperlipidemia, unspecified: Secondary | ICD-10-CM

## 2011-07-05 DIAGNOSIS — I10 Essential (primary) hypertension: Secondary | ICD-10-CM

## 2011-07-05 LAB — CBC WITH DIFFERENTIAL/PLATELET
Basophils Relative: 0.7 % (ref 0.0–3.0)
Eosinophils Relative: 3 % (ref 0.0–5.0)
Lymphocytes Relative: 28.9 % (ref 12.0–46.0)
MCV: 90.2 fl (ref 78.0–100.0)
Monocytes Relative: 8.2 % (ref 3.0–12.0)
Neutrophils Relative %: 59.2 % (ref 43.0–77.0)
RBC: 3.97 Mil/uL (ref 3.87–5.11)
WBC: 6.2 10*3/uL (ref 4.5–10.5)

## 2011-07-05 LAB — BASIC METABOLIC PANEL
Calcium: 9.2 mg/dL (ref 8.4–10.5)
Creatinine, Ser: 1.1 mg/dL (ref 0.4–1.2)
GFR: 54.17 mL/min — ABNORMAL LOW (ref >60.00)
Sodium: 143 mEq/L (ref 135–145)

## 2011-07-05 LAB — POCT URINALYSIS DIPSTICK
Glucose, UA: NEGATIVE
Ketones, UA: NEGATIVE
Spec Grav, UA: 1.02

## 2011-07-05 LAB — TSH: TSH: 1.97 u[IU]/mL (ref 0.35–5.50)

## 2011-07-05 MED ORDER — ATENOLOL 25 MG PO TABS: 25.0000 mg | ORAL_TABLET | Freq: Every day | ORAL | Status: DC

## 2011-07-05 MED ORDER — LEVOTHYROXINE SODIUM 100 MCG PO TABS: 100.0000 ug | ORAL_TABLET | Freq: Every day | ORAL | Status: DC

## 2011-07-05 MED ORDER — HYDROCHLOROTHIAZIDE 12.5 MG PO TABS: 12.5000 mg | ORAL_TABLET | Freq: Every day | ORAL | Status: DC

## 2011-07-05 NOTE — Progress Notes (Signed)
  Subjective:    Patient ID: Ariana Henderson, female    DOB: [birth year], 71 y.o.   MRN: [id number]  HPI Ariana Henderson is a 71 year old married female nonsmoker who comes in today for a Medicare wellness examination because of a history of hypertension, venous insufficiency, hypothyroidism, mild osteoarthritis  Her medications reviewed in detail the been no changes.  She gets routine eye care, hearing normal, regular dental care, colonoscopy and GI, BSE monthly, and you mammography, vaccinations up-to-date,  Cognitive function normal she walks on a regular basis. Home health safety reviewed no issues identified, no guns in the house, she does have a health care power of attorney and living well   Review of Systems  Constitutional: Negative.   HENT: Negative.   Eyes: Negative.   Respiratory: Negative.   Cardiovascular: Negative.   Gastrointestinal: Negative.   Genitourinary: Negative.   Musculoskeletal: Negative.   Neurological: Negative.   Hematological: Negative.   Psychiatric/Behavioral: Negative.        Objective:   Physical Exam  Constitutional: She appears well-developed and well-nourished.  HENT:  Head: Normocephalic and atraumatic.  Right Ear: External ear normal.  Left Ear: External ear normal.  Nose: Nose normal.  Mouth/Throat: Oropharynx is clear and moist.  Eyes: EOM are normal. Pupils are equal, round, and reactive to light.  Neck: Normal range of motion. Neck supple. No thyromegaly present.  Cardiovascular: Normal rate, regular rhythm, normal heart sounds and intact distal pulses.  Exam reveals no gallop and no friction rub.   No murmur heard. Pulmonary/Chest: Effort normal and breath sounds normal.  Abdominal: Soft. Bowel sounds are normal. She exhibits no distension and no mass. There is no tenderness. There is no rebound.  Genitourinary: Vagina normal and uterus normal. Guaiac negative stool. No vaginal discharge found.       Bilateral breast exam normal    Musculoskeletal: Normal range of motion.  Lymphadenopathy:    She has no cervical adenopathy.  Neurological: She is alert. She has normal reflexes. No cranial nerve deficit. She exhibits normal muscle tone. Coordination normal.  Skin: Skin is warm and dry.       Total body skin exam normal except for numerous seborrheic keratoses  Psychiatric: She has a normal mood and affect. Her behavior is normal. Judgment and thought content normal.          Assessment & Plan:  Healthy female  Hypertension continue Tenormin 25 mg daily  Venous insufficiency continue hydrochlorothiazide 12.5 mg daily  Hypothyroidism continue Synthroid 100 mcg daily check labs  Osteoarthritis Motrin 400 twice a day

## 2011-07-05 NOTE — Patient Instructions (Signed)
Continue your current medications  At Motrin 400 mg twice daily with food for joint pain  Return in one year sooner if any problems

## 2011-07-13 ENCOUNTER — Other Ambulatory Visit: Payer: Self-pay | Admitting: Family Medicine

## 2011-07-13 DIAGNOSIS — Z1231 Encounter for screening mammogram for malignant neoplasm of breast: Secondary | ICD-10-CM

## 2011-07-26 ENCOUNTER — Ambulatory Visit
Admission: RE | Admit: 2011-07-26 | Discharge: 2011-07-26 | Disposition: A | Discharge status: Home / Self Care | Payer: Medicare Other | Source: Ambulatory Visit | Attending: Family Medicine | Admitting: Family Medicine

## 2011-07-26 DIAGNOSIS — Z1231 Encounter for screening mammogram for malignant neoplasm of breast: Secondary | ICD-10-CM

## 2011-10-18 ENCOUNTER — Encounter: Payer: Self-pay | Admitting: Family Medicine

## 2011-10-18 ENCOUNTER — Ambulatory Visit (INDEPENDENT_AMBULATORY_CARE_PROVIDER_SITE_OTHER): Payer: Medicare Other | Admitting: Family Medicine

## 2011-10-18 VITALS — BP 140/80 | Temp 97.4°F | Wt 174.0 lb

## 2011-10-18 DIAGNOSIS — J45909 Unspecified asthma, uncomplicated: Secondary | ICD-10-CM

## 2011-10-18 DIAGNOSIS — N3090 Cystitis, unspecified without hematuria: Secondary | ICD-10-CM | POA: Insufficient documentation

## 2011-10-18 DIAGNOSIS — R300 Dysuria: Secondary | ICD-10-CM

## 2011-10-18 LAB — POCT URINALYSIS DIPSTICK
Glucose, UA: NEGATIVE
Nitrite, UA: NEGATIVE
Protein, UA: NEGATIVE
Urobilinogen, UA: 0.2

## 2011-10-18 MED ORDER — BECLOMETHASONE DIPROPIONATE 40 MCG/ACT IN AERS: INHALATION_SPRAY | RESPIRATORY_TRACT | Status: DC

## 2011-10-18 MED ORDER — CEPHALEXIN 500 MG PO CAPS: ORAL_CAPSULE | ORAL | Status: DC

## 2011-10-18 NOTE — Progress Notes (Signed)
  Subjective:    Patient ID: Ariana Henderson, female    DOB: [birth year], 71 y.o.   MRN: [id number]  HPI Kaylyn Layer is a 71 year old married female nonsmoker who comes in today for evaluation of a cough and hearing tract infection  She's had symptoms urinary tract infection frequency and dysuria for one week. No fever chills or back pain  A month ago she developed what she thought was a cold in her description attacks we have flareup of her allergies. The cough has persisted. No fever no sputum production   Review of Systems Gen. urologic and pulmonary review of systems otherwise negative    Objective:   Physical Exam Well-developed well-nourished female no acute distress examination the lungs show symmetrical breath sounds weight mild expiratory wheezing on forced expiration abdominal exam negative urinalysis consistent with UTI       Assessment & Plan:  UTI Keflex 500 twice a day for one week  Reactive airway disease/asthma plan and he'll steroid twice a day patient intolerant of oral steroids

## 2011-10-18 NOTE — Patient Instructions (Signed)
Drink lots of water  Keflex one twice daily to bottle empty for UTI  Qvar,,,,,,,,,,, 1 puff twice daily

## 2012-02-08 ENCOUNTER — Ambulatory Visit (INDEPENDENT_AMBULATORY_CARE_PROVIDER_SITE_OTHER): Payer: Medicare Other

## 2012-02-08 DIAGNOSIS — Z23 Encounter for immunization: Secondary | ICD-10-CM

## 2012-04-24 ENCOUNTER — Encounter: Payer: Self-pay | Admitting: Family Medicine

## 2012-04-24 ENCOUNTER — Ambulatory Visit (INDEPENDENT_AMBULATORY_CARE_PROVIDER_SITE_OTHER): Payer: Medicare Other | Admitting: Family Medicine

## 2012-04-24 VITALS — BP 120/82 | Temp 97.9°F

## 2012-04-24 DIAGNOSIS — J45909 Unspecified asthma, uncomplicated: Secondary | ICD-10-CM

## 2012-04-24 MED ORDER — PREDNISONE 20 MG PO TABS: ORAL_TABLET | ORAL | Status: DC

## 2012-04-24 NOTE — Progress Notes (Signed)
  Subjective:    Patient ID: Ariana Henderson, female    DOB: 1941/02/25, 72 y.o.   MRN: [id number]  HPI Emmajo is a 72 year old married female nonsmoker who comes in today for evaluation of a cough for 4 weeks  She has a history of underlying allergic rhinitis for which she takes over-the-counter medications. About 4 weeks ago she developed a cough. No wheezing. No fever no sputum production.  Historically she's had reactive airway disease in the past   Review of Systems    general and pulmonary review of systems otherwise negative Objective:   Physical Exam Well-developed and nourished female no acute distress HEENT negative neck was supple no adenopathy lungs are clear       Assessment & Plan:

## 2012-04-24 NOTE — Patient Instructions (Signed)
Take the prednisone as directed,,,,,,,,,,,,, 2 tabs x3 days or until you feel a lot better,,,,,,,, then begin to taper  Return when necessary

## 2012-06-13 ENCOUNTER — Telehealth: Payer: Self-pay | Admitting: Family Medicine

## 2012-06-13 DIAGNOSIS — I10 Essential (primary) hypertension: Secondary | ICD-10-CM

## 2012-06-13 DIAGNOSIS — E039 Hypothyroidism, unspecified: Secondary | ICD-10-CM

## 2012-06-13 MED ORDER — ATENOLOL 25 MG PO TABS: 25.0000 mg | ORAL_TABLET | Freq: Every day | ORAL | Status: DC

## 2012-06-13 MED ORDER — LEVOTHYROXINE SODIUM 100 MCG PO TABS: 100.0000 ug | ORAL_TABLET | Freq: Every day | ORAL | Status: DC

## 2012-06-13 MED ORDER — HYDROCHLOROTHIAZIDE 12.5 MG PO TABS: 12.5000 mg | ORAL_TABLET | Freq: Every day | ORAL | Status: DC

## 2012-06-13 NOTE — Telephone Encounter (Signed)
Pt had to resc CPE, and needs refill on all meds:  atenolol (TENORMIN) 25 MG tablet hydrochlorothiazide (HYDRODIURIL) 12.5 MG tablet levothyroxine (SYNTHROID, LEVOTHROID) 100 MCG tablet   [facility]/ [place]

## 2012-09-05 ENCOUNTER — Encounter: Payer: Self-pay | Admitting: Family Medicine

## 2012-09-05 ENCOUNTER — Ambulatory Visit (INDEPENDENT_AMBULATORY_CARE_PROVIDER_SITE_OTHER): Payer: Medicare Other | Admitting: Family Medicine

## 2012-09-05 VITALS — BP 130/90 | Temp 97.8°F | Ht 68.5 in | Wt 171.0 lb

## 2012-09-05 DIAGNOSIS — I872 Venous insufficiency (chronic) (peripheral): Secondary | ICD-10-CM

## 2012-09-05 DIAGNOSIS — D649 Anemia, unspecified: Secondary | ICD-10-CM

## 2012-09-05 DIAGNOSIS — I10 Essential (primary) hypertension: Secondary | ICD-10-CM

## 2012-09-05 DIAGNOSIS — Z0000 Encounter for general adult medical examination without abnormal findings: Secondary | ICD-10-CM

## 2012-09-05 DIAGNOSIS — E039 Hypothyroidism, unspecified: Secondary | ICD-10-CM

## 2012-09-05 DIAGNOSIS — E785 Hyperlipidemia, unspecified: Secondary | ICD-10-CM

## 2012-09-05 LAB — BASIC METABOLIC PANEL
BUN: 22 mg/dL (ref 6–23)
CO2: 27 mEq/L (ref 19–32)
Chloride: 104 mEq/L (ref 96–112)
Creatinine, Ser: 1.1 mg/dL (ref 0.4–1.2)

## 2012-09-05 LAB — POCT URINALYSIS DIPSTICK
Ketones, UA: NEGATIVE
Protein, UA: NEGATIVE
Spec Grav, UA: 1.015
pH, UA: 7

## 2012-09-05 LAB — CBC WITH DIFFERENTIAL/PLATELET
Basophils Relative: 0.4 % (ref 0.0–3.0)
Eosinophils Relative: 1.3 % (ref 0.0–5.0)
HCT: 37.2 % (ref 36.0–46.0)
Lymphs Abs: 1.8 10*3/uL (ref 0.7–4.0)
MCV: 91.9 fl (ref 78.0–100.0)
Monocytes Absolute: 0.5 10*3/uL (ref 0.1–1.0)
RBC: 4.05 Mil/uL (ref 3.87–5.11)
WBC: 7.6 10*3/uL (ref 4.5–10.5)

## 2012-09-05 LAB — HEPATIC FUNCTION PANEL
AST: 22 U/L (ref 0–37)
Total Bilirubin: 0.7 mg/dL (ref 0.3–1.2)

## 2012-09-05 LAB — LIPID PANEL
HDL: 45.6 mg/dL (ref >39.00)
Total CHOL/HDL Ratio: 3
VLDL: 17.6 mg/dL (ref 0.0–40.0)

## 2012-09-05 MED ORDER — ATENOLOL 25 MG PO TABS: 25.0000 mg | ORAL_TABLET | Freq: Every day | ORAL | Status: DC

## 2012-09-05 MED ORDER — HYDROCHLOROTHIAZIDE 12.5 MG PO TABS: 12.5000 mg | ORAL_TABLET | Freq: Every day | ORAL | Status: DC

## 2012-09-05 MED ORDER — LEVOTHYROXINE SODIUM 100 MCG PO TABS: 100.0000 ug | ORAL_TABLET | Freq: Every day | ORAL | Status: DC

## 2012-09-05 NOTE — Patient Instructions (Signed)
Call today and setup for your screening mammogram  Continue current medications  Walk 30 minutes daily  Return in one year sooner if any problems

## 2012-09-05 NOTE — Progress Notes (Signed)
  Subjective:    Patient ID: Ariana Henderson, female    DOB: 10-29-1940, 72 y.o.   MRN: [id number]  HPI Ariana Henderson is a delightful 72 year old married female nonsmoker who comes in today for a Medicare wellness examination because of a history of hypertension, venous insufficiency, hypothyroidism  We did her pelvic examination 2 years ago was normal. She's asymptomatic  She states she's had no change in her bowel habits no rectal bleeding. At one time we had sent her for colonoscopy she listened to their discretion and declined not to have one. We did again discuss screening colonoscopy and again she declines.  She gets routine eye care, dental care twice yearly, BSE monthly, due for her annual mammogram. Again declined colonoscopy. Vaccinations up-to-date, information given on shingles. She  Cognitive function normal home health safety reviewed no issues identified, he exercises on a regular basis, she does have a health care power of attorney and living well   Review of Systems  Constitutional: Negative.   HENT: Negative.   Eyes: Negative.   Respiratory: Negative.   Cardiovascular: Negative.   Gastrointestinal: Negative.   Genitourinary: Negative.   Musculoskeletal: Negative.   Neurological: Negative.   Psychiatric/Behavioral: Negative.        Objective:   Physical Exam  Constitutional: She appears well-developed and well-nourished.  HENT:  Head: Normocephalic and atraumatic.  Right Ear: External ear normal.  Left Ear: External ear normal.  Nose: Nose normal.  Mouth/Throat: Oropharynx is clear and moist.  Eyes: EOM are normal. Pupils are equal, round, and reactive to light.  Neck: Normal range of motion. Neck supple. No thyromegaly present.  Cardiovascular: Normal rate, regular rhythm, normal heart sounds and intact distal pulses.  Exam reveals no gallop and no friction rub.   No murmur heard. Pulmonary/Chest: Effort normal and breath sounds normal.  Abdominal: Soft. Bowel  sounds are normal. She exhibits no distension and no mass. There is no tenderness. There is no rebound.  Genitourinary:  Bilateral breast exam normal except for thickening left breast periphery 12 to 6:00 previously been present  Musculoskeletal: Normal range of motion.  Lymphadenopathy:    She has no cervical adenopathy.  Neurological: She is alert. She has normal reflexes. No cranial nerve deficit. She exhibits normal muscle tone. Coordination normal.  Skin: Skin is warm and dry.  Total body skin exam normal except for numerous seborrheic keratosis,,,,,,,,, 100s  Psychiatric: She has a normal mood and affect. Her behavior is normal. Judgment and thought content normal.          Assessment & Plan:  Healthy female  Hypertension continue Tenormin  Venous insufficiency continue hydrochlorothiazide  Hypothyroidism continue Synthroid 100 mcg daily    Again recommended,,,,,,,,, but she declines.......... Screening colonoscopy

## 2012-09-08 ENCOUNTER — Other Ambulatory Visit: Payer: Self-pay

## 2012-09-08 DIAGNOSIS — Z1231 Encounter for screening mammogram for malignant neoplasm of breast: Secondary | ICD-10-CM

## 2012-09-13 ENCOUNTER — Ambulatory Visit
Admission: RE | Admit: 2012-09-13 | Discharge: 2012-09-13 | Disposition: A | Discharge status: Home / Self Care | Payer: Medicare Other | Source: Ambulatory Visit

## 2012-09-13 DIAGNOSIS — Z1231 Encounter for screening mammogram for malignant neoplasm of breast: Secondary | ICD-10-CM

## 2012-10-12 ENCOUNTER — Ambulatory Visit: Payer: Medicare Other

## 2013-01-23 ENCOUNTER — Ambulatory Visit (INDEPENDENT_AMBULATORY_CARE_PROVIDER_SITE_OTHER): Payer: Medicare Other

## 2013-01-23 DIAGNOSIS — Z23 Encounter for immunization: Secondary | ICD-10-CM

## 2013-08-24 ENCOUNTER — Other Ambulatory Visit: Payer: Self-pay

## 2013-08-24 DIAGNOSIS — Z1231 Encounter for screening mammogram for malignant neoplasm of breast: Secondary | ICD-10-CM

## 2013-09-18 ENCOUNTER — Encounter (INDEPENDENT_AMBULATORY_CARE_PROVIDER_SITE_OTHER): Payer: Self-pay

## 2013-09-18 ENCOUNTER — Ambulatory Visit
Admission: RE | Admit: 2013-09-18 | Discharge: 2013-09-18 | Disposition: A | Discharge status: Home / Self Care | Payer: Medicare Other | Source: Ambulatory Visit

## 2013-09-18 DIAGNOSIS — Z1231 Encounter for screening mammogram for malignant neoplasm of breast: Secondary | ICD-10-CM

## 2013-09-20 ENCOUNTER — Encounter: Payer: Medicare Other | Admitting: Family Medicine

## 2013-10-01 ENCOUNTER — Telehealth: Payer: Self-pay | Admitting: Family Medicine

## 2013-10-01 DIAGNOSIS — I10 Essential (primary) hypertension: Secondary | ICD-10-CM

## 2013-10-01 MED ORDER — ATENOLOL 25 MG PO TABS: 25.0000 mg | ORAL_TABLET | Freq: Every day | ORAL | Status: DC

## 2013-10-01 NOTE — Telephone Encounter (Signed)
Rx sent 

## 2013-10-01 NOTE — Telephone Encounter (Signed)
[facility] -  ([place]),  - [place] is requesting re-fill on atenolol (TENORMIN) 25 MG tablet

## 2013-10-02 ENCOUNTER — Telehealth: Payer: Self-pay | Admitting: Family Medicine

## 2013-10-02 NOTE — Telephone Encounter (Signed)
Relevant patient education mailed to patient.  

## 2013-10-04 ENCOUNTER — Encounter: Payer: Self-pay | Admitting: Family Medicine

## 2013-10-04 ENCOUNTER — Ambulatory Visit (INDEPENDENT_AMBULATORY_CARE_PROVIDER_SITE_OTHER): Payer: Medicare Other | Admitting: Family Medicine

## 2013-10-04 VITALS — BP 150/90 | Temp 97.9°F | Ht 68.5 in | Wt 170.4 lb

## 2013-10-04 DIAGNOSIS — I10 Essential (primary) hypertension: Secondary | ICD-10-CM

## 2013-10-04 DIAGNOSIS — I872 Venous insufficiency (chronic) (peripheral): Secondary | ICD-10-CM

## 2013-10-04 DIAGNOSIS — N760 Acute vaginitis: Secondary | ICD-10-CM

## 2013-10-04 DIAGNOSIS — E039 Hypothyroidism, unspecified: Secondary | ICD-10-CM

## 2013-10-04 DIAGNOSIS — N952 Postmenopausal atrophic vaginitis: Secondary | ICD-10-CM

## 2013-10-04 DIAGNOSIS — Z23 Encounter for immunization: Secondary | ICD-10-CM

## 2013-10-04 LAB — BASIC METABOLIC PANEL
BUN: 19 mg/dL (ref 6–23)
CALCIUM: 9.3 mg/dL (ref 8.4–10.5)
CO2: 26 mEq/L (ref 19–32)
Chloride: 106 mEq/L (ref 96–112)
Creatinine, Ser: 1.1 mg/dL (ref 0.4–1.2)
GFR: 51.05 mL/min — ABNORMAL LOW (ref >60.00)
GLUCOSE: 110 mg/dL — AB (ref 70–99)
Potassium: 3.7 mEq/L (ref 3.5–5.1)
Sodium: 141 mEq/L (ref 135–145)

## 2013-10-04 LAB — POCT URINALYSIS DIPSTICK
Bilirubin, UA: NEGATIVE
GLUCOSE UA: NEGATIVE
Ketones, UA: NEGATIVE
Nitrite, UA: NEGATIVE
PH UA: 7
Protein, UA: NEGATIVE
Spec Grav, UA: 1.015
UROBILINOGEN UA: 0.2

## 2013-10-04 LAB — CBC WITH DIFFERENTIAL/PLATELET
BASOS ABS: 0 10*3/uL (ref 0.0–0.1)
Basophils Relative: 0.5 % (ref 0.0–3.0)
Eosinophils Absolute: 0.1 10*3/uL (ref 0.0–0.7)
Eosinophils Relative: 1.9 % (ref 0.0–5.0)
HCT: 37 % (ref 36.0–46.0)
HEMOGLOBIN: 12.6 g/dL (ref 12.0–15.0)
LYMPHS ABS: 2.1 10*3/uL (ref 0.7–4.0)
LYMPHS PCT: 28.2 % (ref 12.0–46.0)
MCHC: 34.1 g/dL (ref 30.0–36.0)
MCV: 89.3 fl (ref 78.0–100.0)
Monocytes Absolute: 0.6 10*3/uL (ref 0.1–1.0)
Monocytes Relative: 8 % (ref 3.0–12.0)
NEUTROS ABS: 4.5 10*3/uL (ref 1.4–7.7)
Neutrophils Relative %: 61.4 % (ref 43.0–77.0)
Platelets: 279 10*3/uL (ref 150.0–400.0)
RBC: 4.15 Mil/uL (ref 3.87–5.11)
RDW: 13.4 % (ref 11.5–15.5)
WBC: 7.4 10*3/uL (ref 4.0–10.5)

## 2013-10-04 LAB — LIPID PANEL
CHOL/HDL RATIO: 3
CHOLESTEROL: 156 mg/dL (ref 0–200)
HDL: 49.5 mg/dL (ref >39.00)
LDL Cholesterol: 90 mg/dL (ref 0–99)
NonHDL: 106.5
TRIGLYCERIDES: 82 mg/dL (ref 0.0–149.0)
VLDL: 16.4 mg/dL (ref 0.0–40.0)

## 2013-10-04 LAB — TSH: TSH: 0.8 u[IU]/mL (ref 0.35–4.50)

## 2013-10-04 LAB — HEPATIC FUNCTION PANEL
ALT: 22 U/L (ref 0–35)
AST: 24 U/L (ref 0–37)
Albumin: 4 g/dL (ref 3.5–5.2)
Alkaline Phosphatase: 49 U/L (ref 39–117)
BILIRUBIN DIRECT: 0.1 mg/dL (ref 0.0–0.3)
TOTAL PROTEIN: 6.8 g/dL (ref 6.0–8.3)
Total Bilirubin: 0.8 mg/dL (ref 0.2–1.2)

## 2013-10-04 MED ORDER — ESTROGENS, CONJUGATED 0.625 MG/GM VA CREA: 1.0000 | TOPICAL_CREAM | Freq: Every day | VAGINAL | Status: AC

## 2013-10-04 MED ORDER — LEVOTHYROXINE SODIUM 100 MCG PO TABS: 100.0000 ug | ORAL_TABLET | Freq: Every day | ORAL | Status: AC

## 2013-10-04 MED ORDER — HYDROCHLOROTHIAZIDE 12.5 MG PO TABS: 12.5000 mg | ORAL_TABLET | Freq: Every day | ORAL | Status: AC

## 2013-10-04 MED ORDER — ATENOLOL 25 MG PO TABS: 25.0000 mg | ORAL_TABLET | Freq: Every day | ORAL | Status: AC

## 2013-10-04 NOTE — Patient Instructions (Signed)
Continue your current medications  Start a walking program 30 minutes daily  Return in one year sooner if any problems  Think about the colonoscopy  Call you insurance company and find out where you can get your best deal on the shingles vaccine

## 2013-10-04 NOTE — Progress Notes (Signed)
Pre visit review using our clinic review tool, if applicable. No additional management support is needed unless otherwise documented below in the visit note. 

## 2013-10-04 NOTE — Progress Notes (Signed)
   Subjective:    Patient ID: Ariana Henderson, female    DOB: 1940-08-06, 73 y.o.   MRN: [id number]  HPI  Ariana Henderson is a 73 year old married female nonsmoker who comes in today for evaluation of hypertension and hypothyroidism  She's always been in excellent health he said no chronic health problems except for the above. Her blood pressure is 150/90 on atenolol 25 mg and hydrochlorothiazide 12.5 mg.  She takes Synthroid 100 mcg daily  She's still working full-time and plans to retire in the fall  She gets routine eye care, dental care, BSE monthly, and you mammography, declines a colonoscopy. She went one time for the explanation and decided she didn't want to have the procedure done. I spent 10 minutes talking to her about the colonoscopy it knees etc. etc. etc. but she still declined to have it done. She understands the risk of undiagnosed colon polyps turning in the metastatic cancer she's willing to take that risk.  She is due to shingles vaccine  Her last period was at age 73 Pap a year ago normal. She does have some postmenopausal vaginal dryness otherwise GYN negative.  Cognitive function normal she walks at work home health safety reviewed no issues identified, no guns in the house, she does have a health care power of attorney and living well   Review of Systems  Constitutional: Negative.   HENT: Negative.   Eyes: Negative.   Respiratory: Negative.   Cardiovascular: Negative.   Gastrointestinal: Negative.   Genitourinary: Negative.   Musculoskeletal: Negative.   Neurological: Negative.   Psychiatric/Behavioral: Negative.        Objective:   Physical Exam  Nursing note and vitals reviewed. Constitutional: She is oriented to person, place, and time. She appears well-developed and well-nourished.  HENT:  Head: Normocephalic and atraumatic.  Right Ear: External ear normal.  Left Ear: External ear normal.  Nose: Nose normal.  Mouth/Throat: Oropharynx is clear and moist.    Eyes: EOM are normal. Pupils are equal, round, and reactive to light.  Neck: Normal range of motion. Neck supple. No thyromegaly present.  Cardiovascular: Normal rate, regular rhythm, normal heart sounds and intact distal pulses.  Exam reveals no gallop and no friction rub.   No murmur heard. Pulmonary/Chest: Effort normal and breath sounds normal.  Abdominal: Soft. Bowel sounds are normal. She exhibits no distension and no mass. There is no tenderness. There is no rebound and no guarding.  Genitourinary:  Bilateral breast exam normal  Musculoskeletal: Normal range of motion.  Lymphadenopathy:    She has no cervical adenopathy.  Neurological: She is alert and oriented to person, place, and time. She has normal reflexes. No cranial nerve deficit. She exhibits normal muscle tone. Coordination normal.  Skin: Skin is warm and dry.  Total body skin exam normal she has 100s of seborrheic keratoses  Psychiatric: She has a normal mood and affect. Her behavior is normal. Judgment and thought content normal.          Assessment & Plan:  Healthy female  Hypertension,,,,,,,,,, not at goal,,,,,,,, continue current medication BP checked daily for 2 weeks. If blood pressures not at goal call  Hypothyroidism continue Synthroid

## 2013-10-10 ENCOUNTER — Encounter (HOSPITAL_COMMUNITY): Payer: Self-pay | Admitting: Emergency Medicine

## 2013-10-10 ENCOUNTER — Emergency Department (HOSPITAL_COMMUNITY)
Admission: EM | Admit: 2013-10-10 | Discharge: 2013-11-03 | Disposition: E | Payer: Medicare Other | Attending: Emergency Medicine | Admitting: Emergency Medicine

## 2013-10-10 DIAGNOSIS — R230 Cyanosis: Secondary | ICD-10-CM | POA: Insufficient documentation

## 2013-10-10 DIAGNOSIS — I469 Cardiac arrest, cause unspecified: Secondary | ICD-10-CM | POA: Insufficient documentation

## 2013-10-10 DIAGNOSIS — M25519 Pain in unspecified shoulder: Secondary | ICD-10-CM | POA: Diagnosis present

## 2013-10-11 ENCOUNTER — Encounter: Payer: Self-pay | Admitting: Family Medicine

## 2013-11-03 NOTE — ED Notes (Signed)
Goldtone ring on left and right hand.  Goldtone necklace on

## 2013-11-03 NOTE — ED Notes (Signed)
EMS administered 12 epi, 450 mg Amio, shocked patient 12 times.  Patient was in V fib into PEA idioventricular rhythm.

## 2013-11-03 NOTE — ED Notes (Signed)
[facility] called spoke with Murlean Caller, patient is to be released.  Refused due to age.

## 2013-11-03 NOTE — ED Notes (Signed)
Patient arrived at [phone], Dr. Randal Buba in to do a FAST and no cardiac activity noted.  Patient pronounced DOA by Dr. Randal Buba

## 2013-11-03 NOTE — ED Provider Notes (Signed)
CSN: [id number]     Arrival date & time 10/16/2013  9166 History   First MD Initiated Contact with Patient Oct 16, 2013 0703     No chief complaint on file.    (Consider location/radiation/quality/duration/timing/severity/associated sxs/prior Treatment) The history is provided by the EMS personnel and the spouse. The history is limited by the condition of the patient.   Patient was a 70 YOF with known HTN and stress most recently seen by her pMD last week who developed pain between shoulder blades last night husband reported at first it was severe then patient stated it was getting better.  Patient told husband she loved him then was "unresponsive" and "gurgling".  EMS reported first rhythm was vfib then PEA.  Patient had not been ill prior to yesterday but was under stress due issues with adult son.     No past medical history on file. No past surgical history on file. No family history on file. History  Substance Use Topics  . Smoking status: Not on file  . Smokeless tobacco: Not on file  . Alcohol Use: Not on file   OB History   No data available     Review of Systems  Unable to perform ROS     Allergies  Review of patient's allergies indicates not on file.  Home Medications   Prior to Admission medications   Not on File   There were no vitals taken for this visit. Physical Exam  Nursing note and vitals reviewed. Constitutional: She appears well-developed and well-nourished.  cyanosis  HENT:  Head: Normocephalic and atraumatic.  Right Ear: External ear normal.  Left Ear: External ear normal.  Eyes: Conjunctivae are normal.  Neck: No tracheal deviation present.  Cardiovascular:  No pulses no cardiac activity on [place]  Pulmonary/Chest:  No chest rise  Abdominal: There is no rebound and no guarding.  Musculoskeletal: She exhibits no edema.  Lymphadenopathy:    She has no cervical adenopathy.  Neurological: She is unresponsive. GCS eye subscore is 1. GCS verbal  subscore is 1. GCS motor subscore is 1.  Skin: Skin is intact. She is not diaphoretic. There is cyanosis.    ED Course  Procedures (including critical care time) Labs Review Labs Reviewed - No data to display  Imaging Review No results found.   EKG Interpretation None      MDM   Final diagnoses:  Cardiac arrest  Cardiac rescucitation in the field > 1 hr.  No ROSC.  No cardiac activity on [place].  TOD 638  MDM Consults: Dr. Shawna Orleans who will speak with Dr. Sherren Mocha to certify certificate.      Sabrinna Yearwood Alfonso Patten, MD 2013-10-16 1050

## 2013-11-03 NOTE — Progress Notes (Signed)
Chaplain responded to ED to offer family support for deceased, post-CPR patient. Presented to family, physician, and RN in consultation room B. Provided pastoral presence, and emotional and grief support. Husband decided to wait until the funeral home to see the deceased. Chaplain gathered his contact information for RN and gave family a patient placement card to call when they finalize selection of a funeral home. They appreciated support from care team.   Ariana Henderson [phone]

## 2013-11-03 DEATH — deceased
